# Patient Record
Sex: Female | Born: 2000 | Hispanic: Yes | Marital: Married | Smoking: Never smoker
Health system: Southern US, Community
[De-identification: ages and names within clinical notes are randomized; demographics above are authoritative.]

## PROBLEM LIST (undated history)

## (undated) DIAGNOSIS — J45909 Unspecified asthma, uncomplicated: Secondary | ICD-10-CM

## (undated) DIAGNOSIS — N390 Urinary tract infection, site not specified: Secondary | ICD-10-CM

## (undated) DIAGNOSIS — K5900 Constipation, unspecified: Secondary | ICD-10-CM

---
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
## 2019-08-06 IMAGING — CT CT ABD-PELV W/ CM
2 of 4 series · 16 of 46 positions shown, 18 images · IV contrast (omnipaque)
Comparison: 01/22/2018

CLINICAL DATA: Right lower quadrant pain with nausea for several
days

EXAM:
CT ABDOMEN AND PELVIS WITH CONTRAST
TECHNIQUE: Multidetector CT imaging of the abdomen and pelvis was performed
using the standard protocol following bolus administration of
intravenous contrast.
CONTRAST:  100mL OMNIPAQUE IOHEXOL 300 MG/ML  SOLN

[Series 3: abdomen 5.0 · axial · 0.76mm/px · z∈[-416,+9]mm · 13 of 95 slices shown, 15 images]
[im 5/95  soft-tissue]
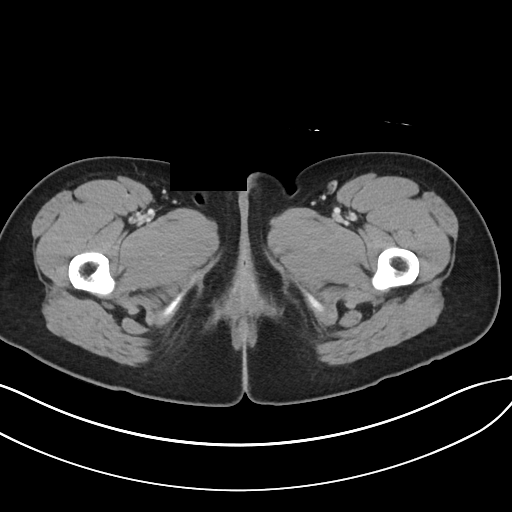
[im 5/95  bone]
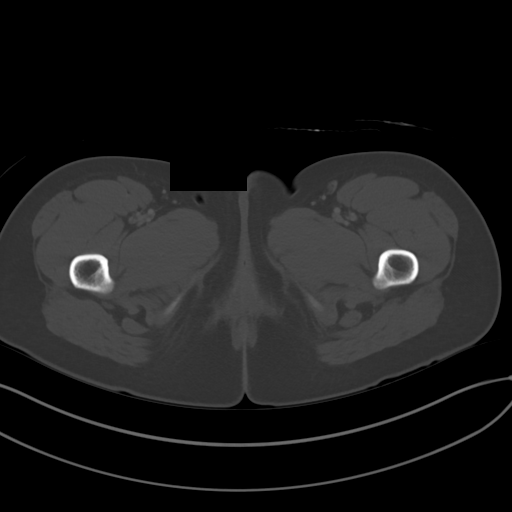
[im 13/95  soft-tissue]
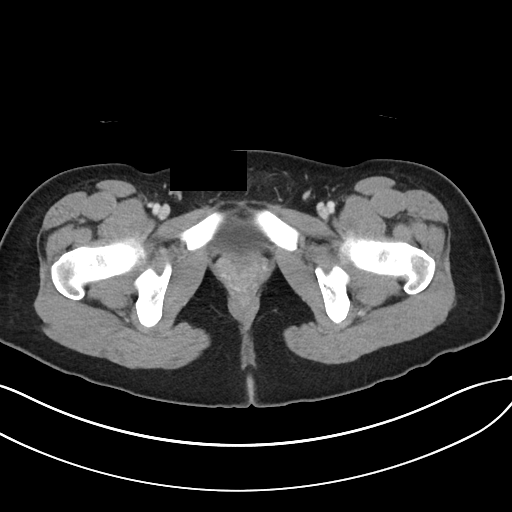
[im 22/95  soft-tissue]
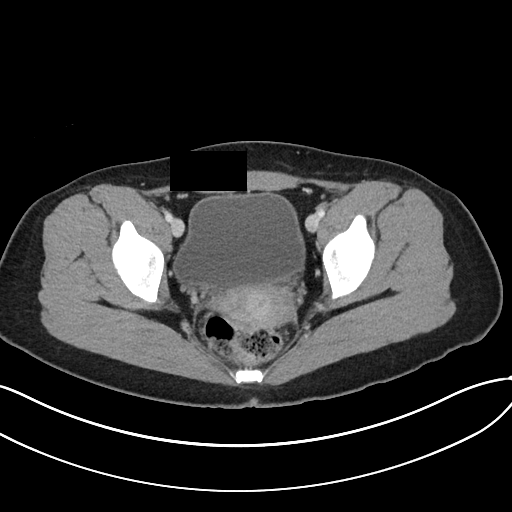
[im 26/95  soft-tissue]
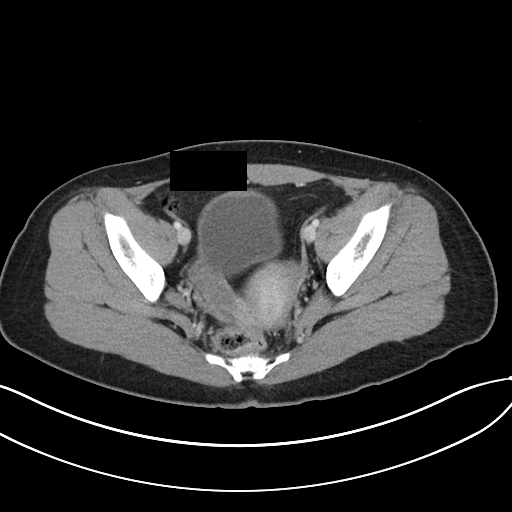
[im 35/95  soft-tissue]
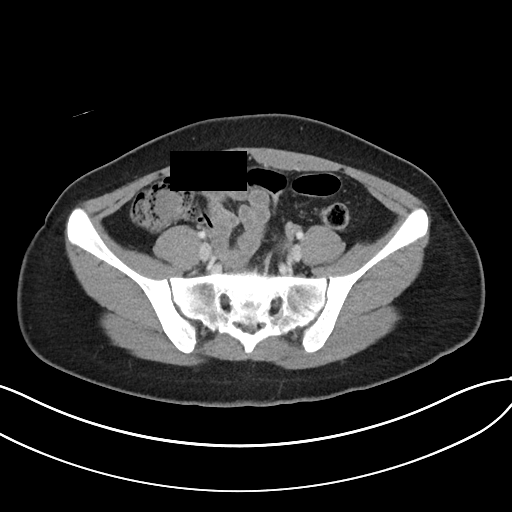
[im 39/95  soft-tissue]
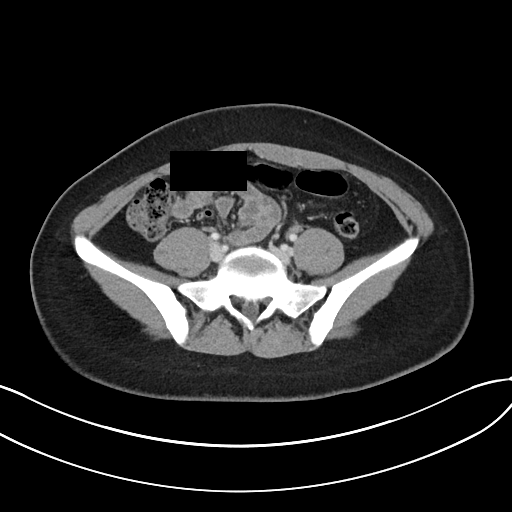
[im 48/95  soft-tissue]
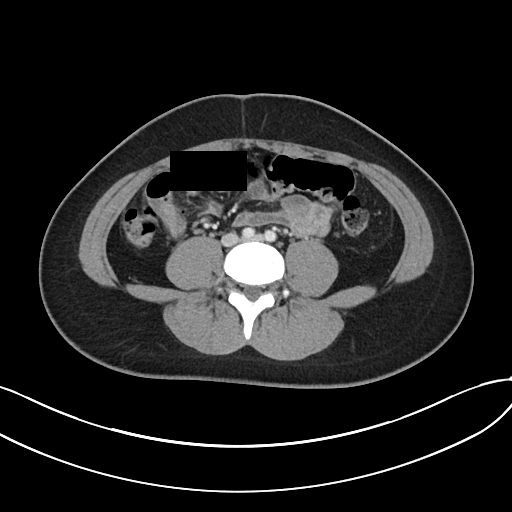
[im 56/95  soft-tissue]
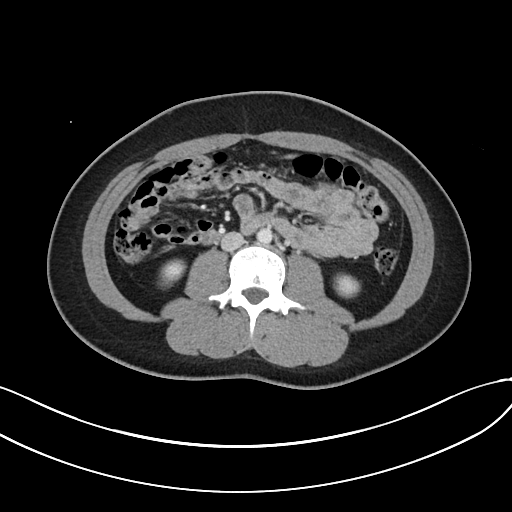
[im 60/95  soft-tissue]
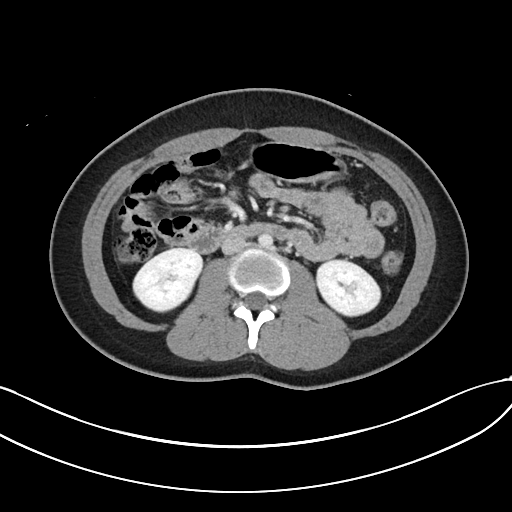
[im 60/95  bone]
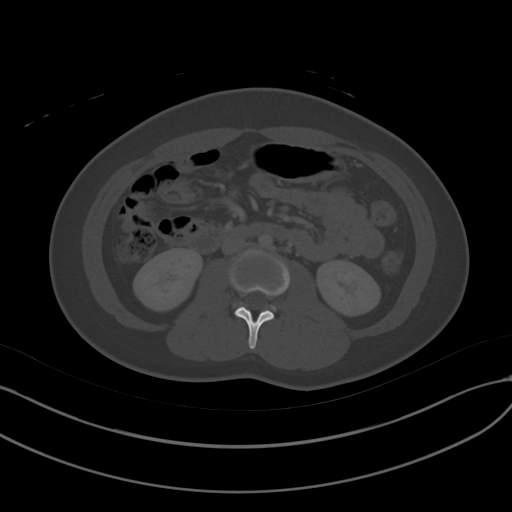
[im 69/95  soft-tissue]
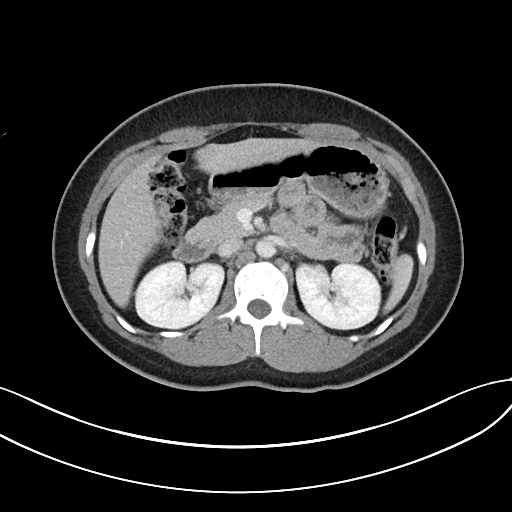
[im 73/95  soft-tissue]
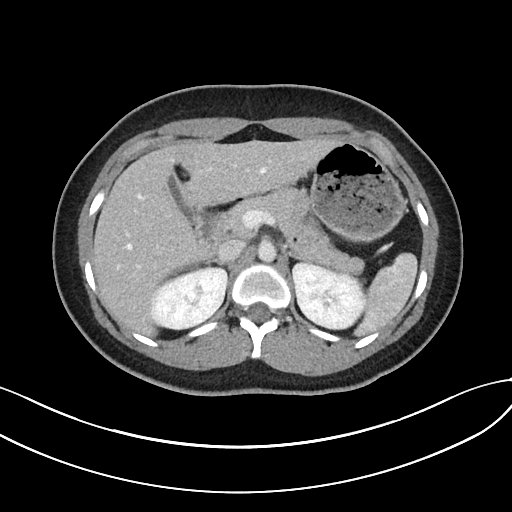
[im 82/95  soft-tissue]
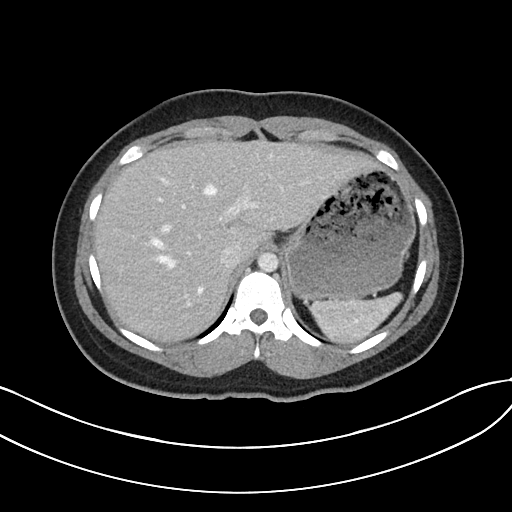
[im 90/95  soft-tissue]
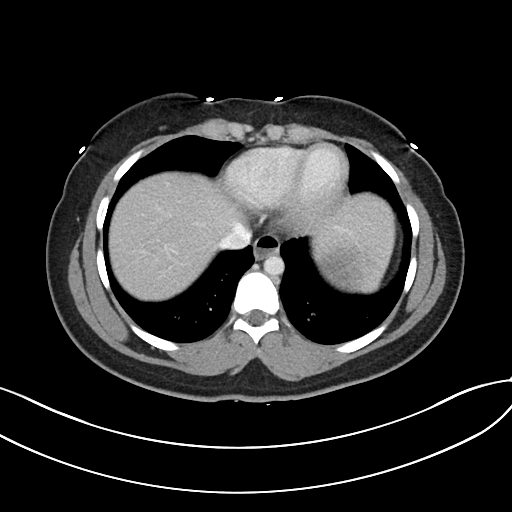

[Series 6: abdomen 3.0 mpr cor · coronal · 0.84mm/px · 3 of 99 slices shown]
[im 33/99  soft-tissue]
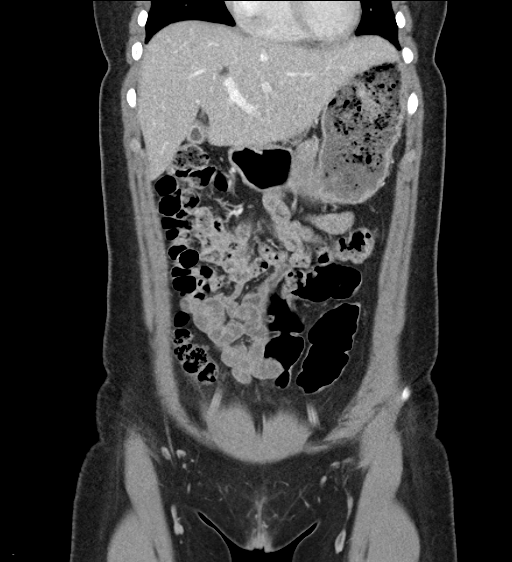
[im 44/99  soft-tissue]
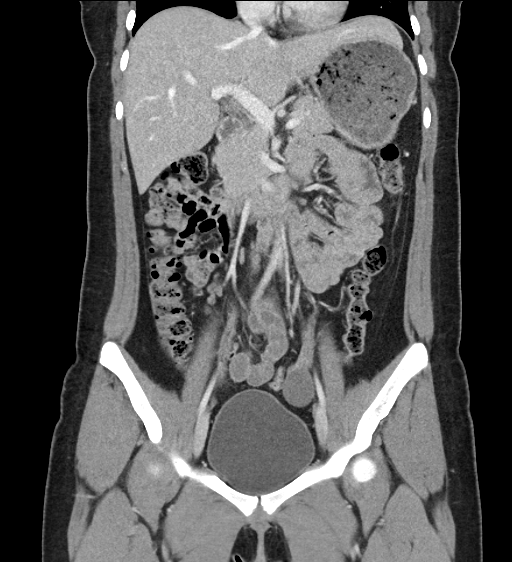
[im 55/99  soft-tissue]
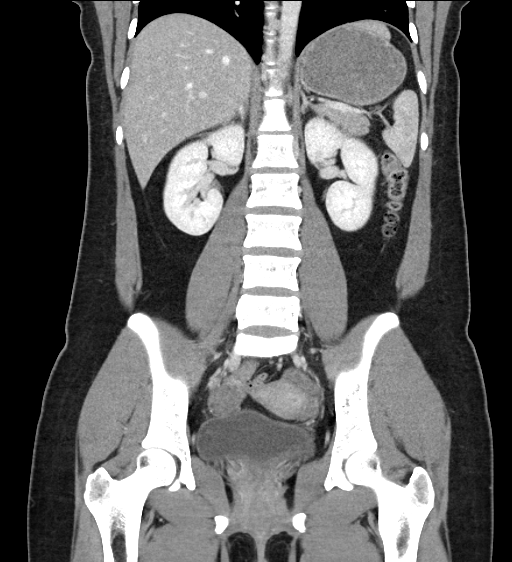

[16 of 46 positions shown; findings below may reference images not displayed]

FINDINGS: Lower chest: No acute abnormality.

Hepatobiliary: Small 1 cm hypodensity is noted within the left lobe
of the liver consistent with small cyst. The gallbladder is within
normal limits.

Pancreas: Unremarkable. No pancreatic ductal dilatation or
surrounding inflammatory changes.

Spleen: Normal in size without focal abnormality.

Adrenals/Urinary Tract: Adrenal glands are within normal limits.
Kidneys are well visualized bilaterally. No renal calculi or
obstructive changes are seen. The bladder is well distended.

Stomach/Bowel: Scattered diverticular changes noted without evidence
of diverticulitis. The appendix is within normal limits. No small
bowel abnormality is seen. The stomach is within normal limits. Mild
fluid is noted within the distal esophagus likely related to reflux.

Vascular/Lymphatic: No significant vascular findings are present. No
enlarged abdominal or pelvic lymph nodes.

Reproductive: Uterus is within normal limits. Follicular changes are
noted in the ovaries bilaterally. A dominant 3 cm cyst is noted
within the left ovary.

Other: No abdominal wall hernia or abnormality. No abdominopelvic
ascites.

Musculoskeletal: No acute or significant osseous findings.
IMPRESSION: No acute abnormality noted.

## 2019-08-30 IMAGING — US US ABDOMEN LIMITED
1 series · 14 of 25 positions shown · non-contrast
Comparison: CT scan of the abdomen dated 10/27/2018

CLINICAL DATA: Abdominal pain.  Elevated liver function tests.

EXAM:
ULTRASOUND ABDOMEN LIMITED RIGHT UPPER QUADRANT

[Series 1: us abdomen limited · 14 of 40 slices shown]
[im 1/40]
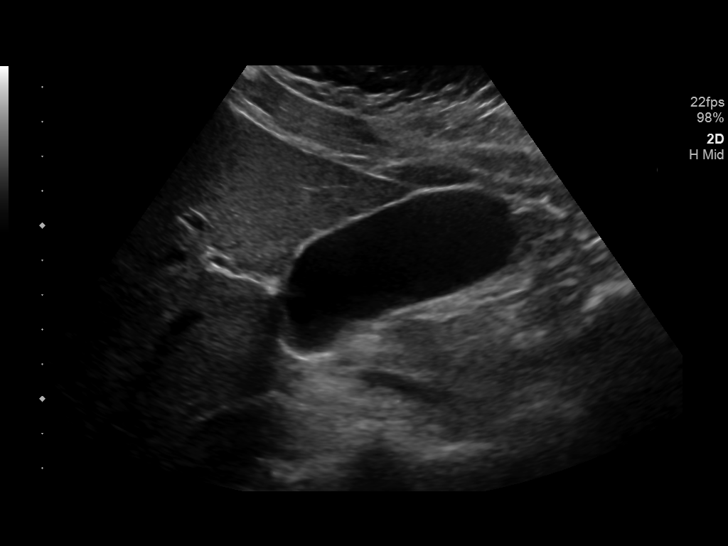
[im 4/40]
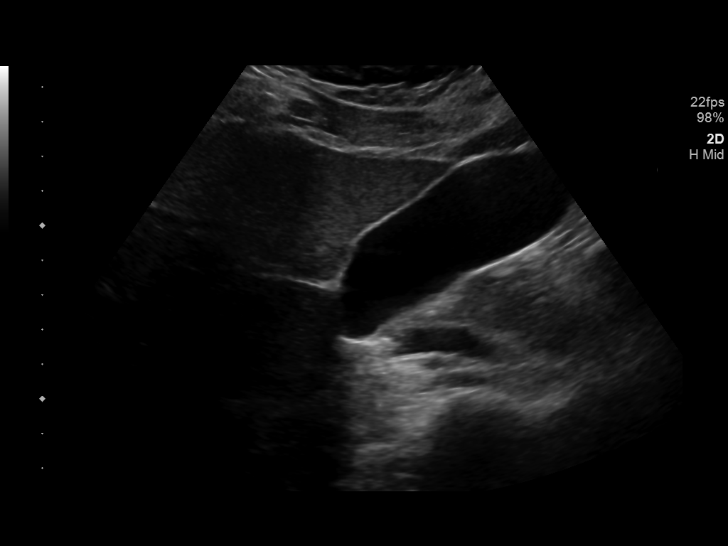
[im 7/40]
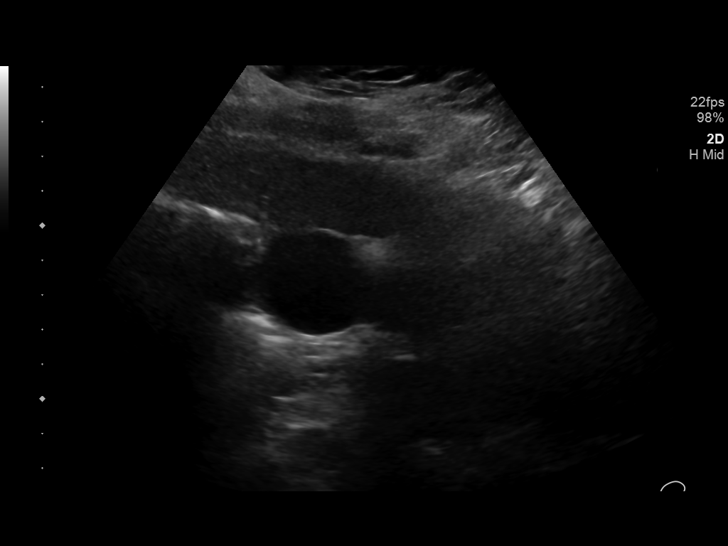
[im 10/40]
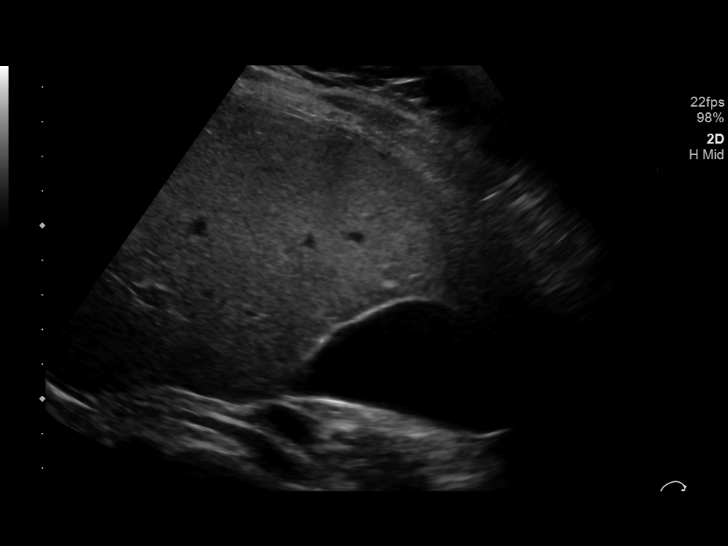
[im 14/40]
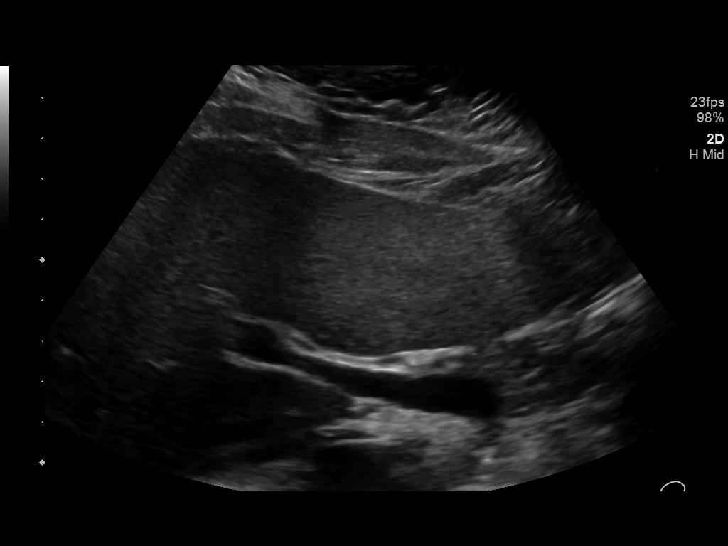
[im 15/40]
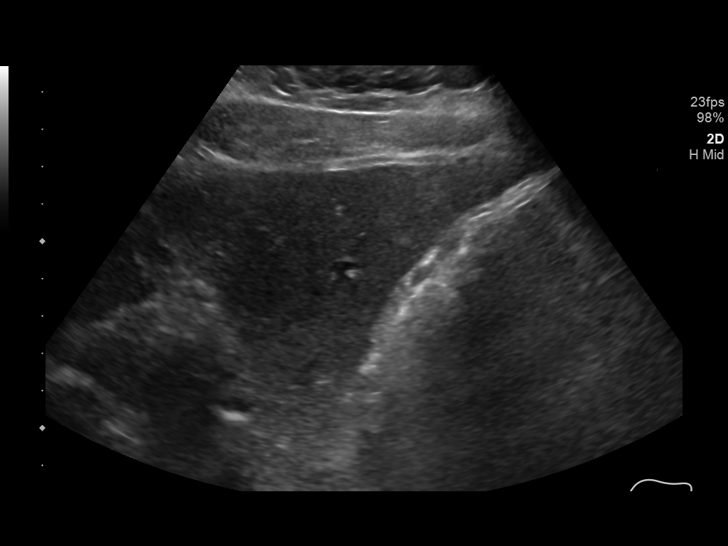
[im 18/40]
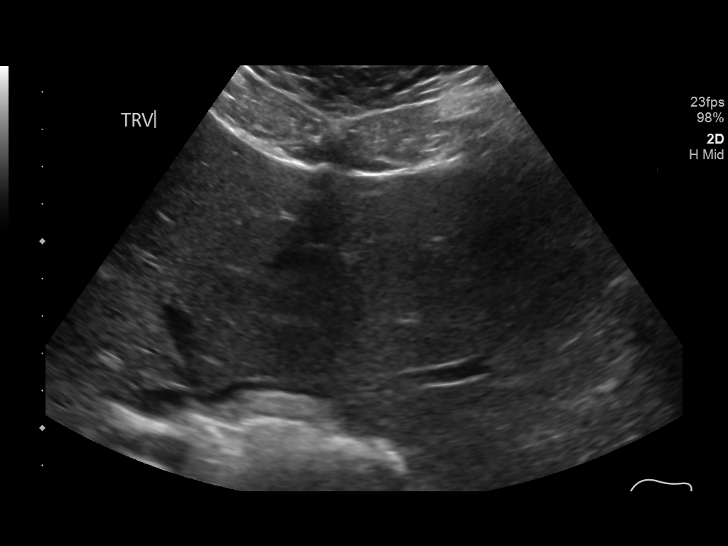
[im 22/40]
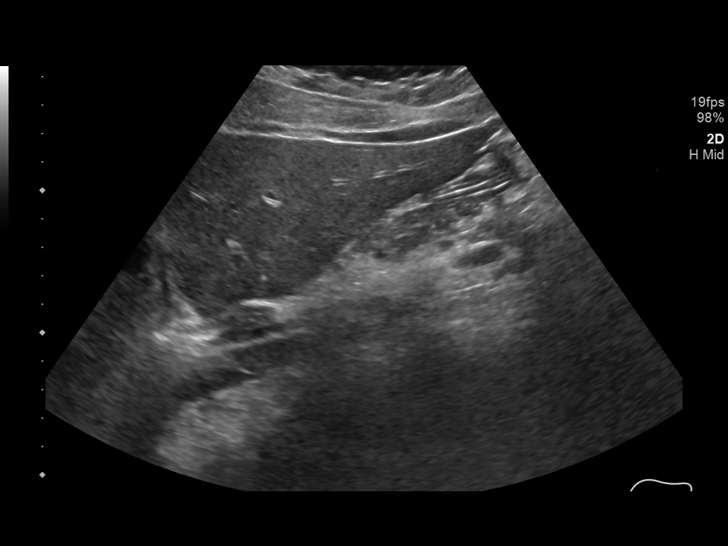
[im 25/40]
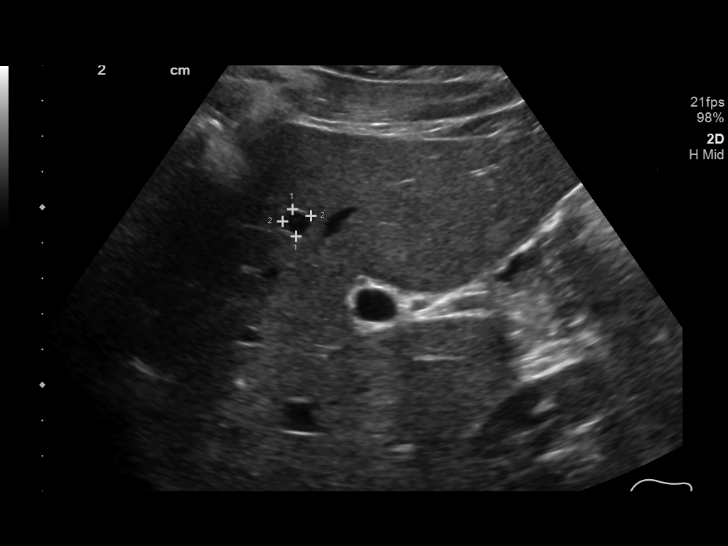
[im 27/40]
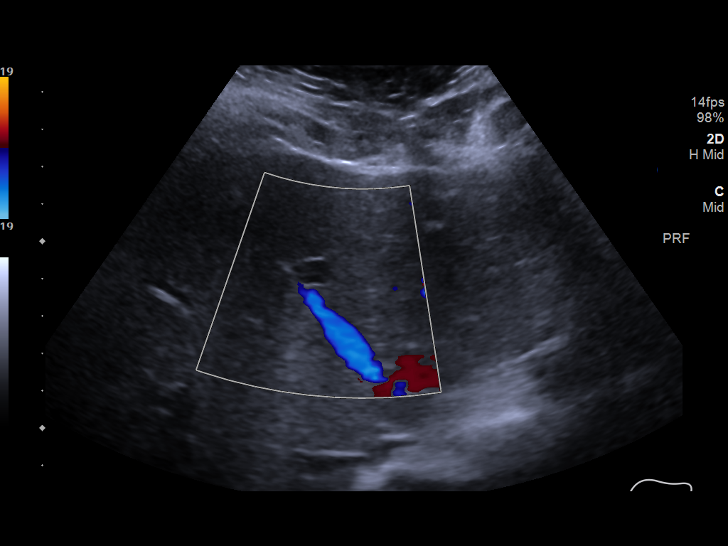
[im 30/40]
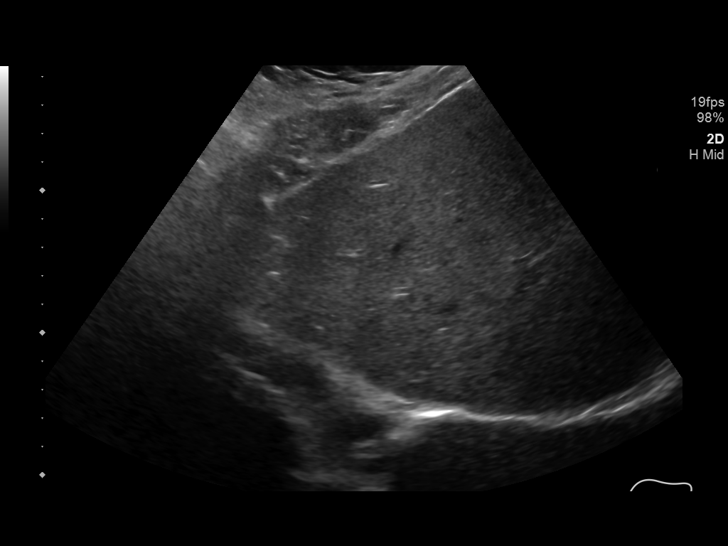
[im 33/40]
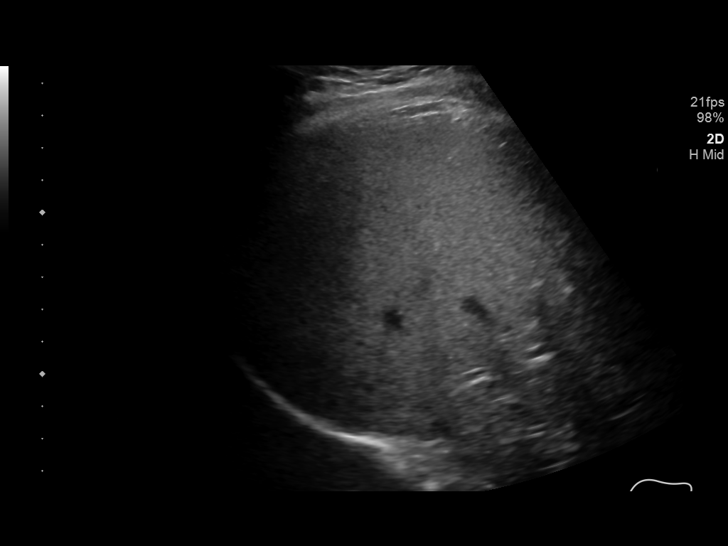
[im 36/40]
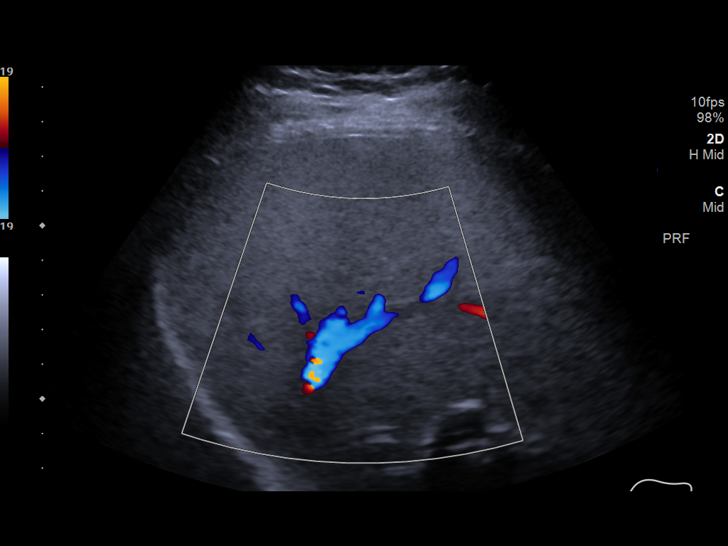
[im 40/40]
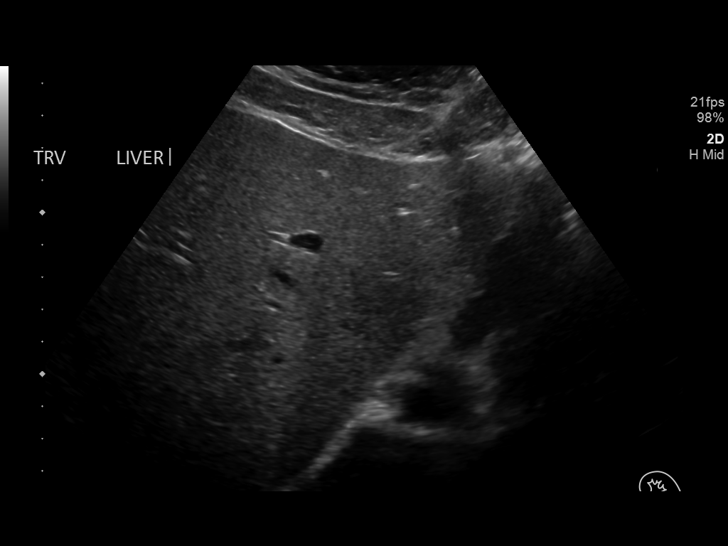

[14 of 25 positions shown; findings below may reference images not displayed]

FINDINGS: Gallbladder:

No gallstones or wall thickening visualized. No sonographic Murphy
sign noted by sonographer.

Common bile duct:

Diameter: 2.1 mm, normal.

Liver:

1.2 cm cyst in the left lobe of the liver as demonstrated on the
prior CT scan. This is not significant. Within normal limits in
parenchymal echogenicity. Portal vein is patent on color Doppler
imaging with normal direction of blood flow towards the liver.
IMPRESSION: No significant abnormalities.

## 2020-02-01 ENCOUNTER — Emergency Department (HOSPITAL_COMMUNITY)
Admission: EM | Admit: 2020-02-01 | Discharge: 2020-02-01 | Disposition: A | Discharge status: Home / Self Care | Payer: Medicaid Other | Attending: Emergency Medicine | Admitting: Emergency Medicine

## 2020-02-01 ENCOUNTER — Encounter (HOSPITAL_COMMUNITY): Payer: Self-pay | Admitting: Emergency Medicine

## 2020-02-01 ENCOUNTER — Other Ambulatory Visit: Payer: Self-pay

## 2020-02-01 DIAGNOSIS — Z5321 Procedure and treatment not carried out due to patient leaving prior to being seen by health care provider: Secondary | ICD-10-CM | POA: Insufficient documentation

## 2020-02-01 DIAGNOSIS — R1030 Lower abdominal pain, unspecified: Secondary | ICD-10-CM | POA: Diagnosis present

## 2020-02-01 LAB — CBC
HCT: 42.2 % (ref 36.0–46.0)
Hemoglobin: 13.6 g/dL (ref 12.0–15.0)
MCH: 30.1 pg (ref 26.0–34.0)
MCHC: 32.2 g/dL (ref 30.0–36.0)
MCV: 93.4 fL (ref 80.0–100.0)
Platelets: 382 10*3/uL (ref 150–400)
RBC: 4.52 MIL/uL (ref 3.87–5.11)
RDW: 13.4 % (ref 11.5–15.5)
WBC: 8.5 10*3/uL (ref 4.0–10.5)
nRBC: 0 % (ref 0.0–0.2)

## 2020-02-01 LAB — COMPREHENSIVE METABOLIC PANEL
ALT: 480 U/L — ABNORMAL HIGH (ref 0–44)
AST: 218 U/L — ABNORMAL HIGH (ref 15–41)
Albumin: 4.3 g/dL (ref 3.5–5.0)
Alkaline Phosphatase: 76 U/L (ref 38–126)
Anion gap: 9 (ref 5–15)
BUN: 5 mg/dL — ABNORMAL LOW (ref 6–20)
CO2: 23 mmol/L (ref 22–32)
Calcium: 9.3 mg/dL (ref 8.9–10.3)
Chloride: 107 mmol/L (ref 98–111)
Creatinine, Ser: 0.56 mg/dL (ref 0.44–1.00)
GFR calc Af Amer: >60 mL/min (ref >60)
GFR calc non Af Amer: >60 mL/min (ref >60)
Glucose, Bld: 98 mg/dL (ref 70–99)
Potassium: 4.8 mmol/L (ref 3.5–5.1)
Sodium: 139 mmol/L (ref 135–145)
Total Bilirubin: 0.4 mg/dL (ref 0.3–1.2)
Total Protein: 7.9 g/dL (ref 6.5–8.1)

## 2020-02-01 LAB — I-STAT BETA HCG BLOOD, ED (MC, WL, AP ONLY): I-stat hCG, quantitative: <5 m[IU]/mL (ref <5)

## 2020-02-01 LAB — LIPASE, BLOOD: Lipase: 32 U/L (ref 11–51)

## 2020-02-01 NOTE — ED Triage Notes (Signed)
Pt endorses intermittent lower abd pain and nausea for the month.

## 2020-03-10 ENCOUNTER — Ambulatory Visit (INDEPENDENT_AMBULATORY_CARE_PROVIDER_SITE_OTHER): Payer: Medicaid Other | Admitting: Obstetrics & Gynecology

## 2020-03-10 ENCOUNTER — Encounter: Payer: Self-pay | Admitting: Obstetrics & Gynecology

## 2020-03-10 ENCOUNTER — Other Ambulatory Visit: Payer: Self-pay

## 2020-03-10 VITALS — BP 112/65 | HR 59 | Wt 159.2 lb

## 2020-03-10 DIAGNOSIS — R1031 Right lower quadrant pain: Secondary | ICD-10-CM

## 2020-03-10 DIAGNOSIS — Z23 Encounter for immunization: Secondary | ICD-10-CM

## 2020-03-10 DIAGNOSIS — Z8742 Personal history of other diseases of the female genital tract: Secondary | ICD-10-CM

## 2020-03-10 DIAGNOSIS — Z3202 Encounter for pregnancy test, result negative: Secondary | ICD-10-CM | POA: Diagnosis not present

## 2020-03-10 DIAGNOSIS — Z7189 Other specified counseling: Secondary | ICD-10-CM | POA: Diagnosis not present

## 2020-03-10 DIAGNOSIS — N83201 Unspecified ovarian cyst, right side: Secondary | ICD-10-CM | POA: Diagnosis not present

## 2020-03-10 DIAGNOSIS — Z7185 Encounter for immunization safety counseling: Secondary | ICD-10-CM

## 2020-03-10 DIAGNOSIS — Z3045 Encounter for surveillance of transdermal patch hormonal contraceptive device: Secondary | ICD-10-CM

## 2020-03-10 LAB — POCT PREGNANCY, URINE: Preg Test, Ur: NEGATIVE

## 2020-03-10 MED ORDER — IBUPROFEN 800 MG PO TABS: 800.0000 mg | ORAL_TABLET | Freq: Three times a day (TID) | ORAL | 3 refills | Status: DC | PRN

## 2020-03-10 MED ORDER — XULANE 150-35 MCG/24HR TD PTWK: 1.0000 | MEDICATED_PATCH | TRANSDERMAL | 5 refills | Status: DC

## 2020-03-10 NOTE — Patient Instructions (Addendum)
Return to clinic for any scheduled appointments or for any gynecologic concerns as needed.   HPV (Human Papillomavirus) Vaccine: What You Need to Know 1. Why get vaccinated? HPV (Human papillomavirus) vaccine can prevent infection with some types of human papillomavirus. HPV infections can cause certain types of cancers including:  cervical, vaginal and vulvar cancers in women,  penile cancer in men, and  anal cancers in both men and women. HPV vaccine prevents infection from the HPV types that cause over 90% of these cancers. HPV is spread through intimate skin-to-skin or sexual contact. HPV infections are so common that nearly all men and women will get at least one type of HPV at some time in their lives. Most HPV infections go away by themselves within 2 years. But sometimes HPV infections will last longer and can cause cancers later in life. 2. HPV vaccine HPV vaccine is routinely recommended for adolescents at 50 or 20 years of age to ensure they are protected before they are exposed to the virus. HPV vaccine may be given beginning at age 70 years, and as late as age 31 years. Most people older than 26 years will not benefit from HPV vaccination. Talk with your health care provider if you want more information. Most children who get the first dose before 21 years of age need 2 doses of HPV vaccine. Anyone who gets the first dose on or after 20 years of age, and younger people with certain immunocompromising conditions, need 3 doses. Your health care provider can give you more information. HPV vaccine may be given at the same time as other vaccines. 3. Talk with your health care provider Tell your vaccine provider if the person getting the vaccine:  Has had an allergic reaction after a previous dose of HPV vaccine, or has any severe, life-threatening allergies.  Is pregnant. In some cases, your health care provider may decide to postpone HPV vaccination to a future visit. People with  minor illnesses, such as a cold, may be vaccinated. People who are moderately or severely ill should usually wait until they recover before getting HPV vaccine. Your health care provider can give you more information. 4. Risks of a vaccine reaction  Soreness, redness, or swelling where the shot is given can happen after HPV vaccine.  Fever or headache can happen after HPV vaccine. People sometimes faint after medical procedures, including vaccination. Tell your provider if you feel dizzy or have vision changes or ringing in the ears. As with any medicine, there is a very remote chance of a vaccine causing a severe allergic reaction, other serious injury, or death. 5. What if there is a serious problem? An allergic reaction could occur after the vaccinated person leaves the clinic. If you see signs of a severe allergic reaction (hives, swelling of the face and throat, difficulty breathing, a fast heartbeat, dizziness, or weakness), call 9-1-1 and get the person to the nearest hospital. For other signs that concern you, call your health care provider. Adverse reactions should be reported to the Vaccine Adverse Event Reporting System (VAERS). Your health care provider will usually file this report, or you can do it yourself. Visit the VAERS website at www.vaers.LAgents.no or call 435-706-6593. VAERS is only for reporting reactions, and VAERS staff do not give medical advice. 6. The National Vaccine Injury Compensation Program The [employer] Vaccine Injury Compensation Program (VICP) is a federal program that was created to compensate people who may have been injured by certain vaccines. Visit the [facility] website  at SpiritualWord.at or call 629 385 2984 to learn about the program and about filing a claim. There is a time limit to file a claim for compensation. 7. How can I learn more?  Ask your health care provider.  Call your local or state health department.  Contact the Centers for  Disease Control and Prevention (CDC): ? Call 3023386000 (1-800-CDC-INFO) or ? Visit CDC's website at PicCapture.uy Vaccine Information Statement HPV Vaccine (07/15/2018) This information is not intended to replace advice given to you by your health care provider. Make sure you discuss any questions you have with your health care provider. Document Revised: 12/22/2018 Document Reviewed: 04/14/2018 Elsevier Patient Education  2020 Elsevier Inc.   Preventing Cervical Cancer Cervical cancer is cancer that grows on the cervix. The cervix is at the bottom of the uterus. It connects the uterus to the vagina. The uterus is where a baby develops during pregnancy. Cancer occurs when cells become abnormal and start to grow out of control. If cervical cancer is not found early, it can spread and become dangerous. Cervical cancer cannot always be prevented, but you can take steps to lower your risk of developing this condition. How can this condition affect me? Cervical cancer grows slowly and may not cause any symptoms at first. Over time, the cancer can grow deep into the cervix tissue and spread to other areas. This may take years, and it may happen without you knowing about it. If it is found early, cervical cancer can be treated effectively. If the cancer has grown deep into your cervix or has spread, it will be more difficult to treat. Most cases of cervical cancer are caused by an STI (sexually transmitted infection) called human papillomavirus (HPV). One way to reduce your risk of cervical cancer is to take steps to avoid infection with the HPV virus. Getting regular Pap tests is also important because this can help identify changes in cells that could lead to cancer. Your chances of getting this disease can also be reduced by making certain lifestyle changes. What can increase my risk? You are more likely to develop this condition if:  You have certain things in your sexual history, such  as: ? Having a sexually transmitted viral infection. These include chlamydia and herpes. ? Having more than one sexual partner, or having sex with someone who has more than one sexual partner. ? Not using condoms during sex. ? Having been sexually active before the age of 9.  Your mother took a medicine called diethylstilbestrol (DES) while pregnant with you, causing you to be exposed to this medicine before birth.  Your mother or sister has had cervical cancer.  You are between the ages of 47-50.  You have or have had certain other medical conditions, such as: ? Previous cancer of the vagina or vulva. ? A weakened body defense system (immune system). ? A history of dysplasia of the cervix.  You use oral contraceptives, also called birth control pills.  You smoke or breathe in secondhand smoke. What actions can I take to prevent cervical cancer? Preventing HPV infection   Ask your health care provider about getting the HPV vaccine. If you are 50 years old or younger, you may need to get this vaccine, which is given in three doses over 6 months. This vaccine protects against the types of HPV that could cause cancer.  Limit the number of people you have sex with. Also avoid having sex with people who have had many sex partners.  Use  a latex condom every time you have sex. Getting Pap tests Get Pap tests regularly, starting at age 5. Talk with your health care provider about how often you need these tests. Having regular Pap tests will help identify changes in cells that could lead to cancer. Steps can then be taken to prevent cancer from developing.  Most women who are 2?20 years of age should have a Pap test every 3 years.  Most women who are 61?20 years of age should have a Pap test in combination with an HPV test every 5 years.  Women with a higher risk of cervical cancer, such as those with a weakened immune system or those who were exposed to DES medicine before birth, may  need more frequent testing. Making other lifestyle changes   Do not use any products that contain nicotine or tobacco, such as cigarettes, e-cigarettes, and chewing tobacco. If you need help quitting, ask your health care provider.  Eat a healthy diet that includes at least 5 servings of fruits and vegetables every day.  Lose weight if you are overweight. Where to find support Talk with your health care provider, school nurse, or local health department for guidance about screening and vaccination. Some children and teens may be able to get the HPV vaccine free of charge through the U.S. government's Vaccines for Children [facility]) program. Other places that provide vaccinations include:  Public health clinics. Check with your local health department.  [place], where you would pay only what you can afford. To find one near you, check this website: http://lyons.com/  [place]. These are part of a program for Medicare and Medicaid patients who live in rural areas. The National Breast and Cervical Cancer Early Detection Program also provides breast and cervical cancer screenings and diagnostic services to low-income, uninsured, and underinsured women. Cervical cancer can be passed down through families. Talk with your health care provider or a genetic counselor to learn more about genetic testing for cancer. Where to find more information Learn more about cervical cancer from:  [employer] of Gynecology: www.acog.org  American Cancer Society: www.cancer.org  Centers for Disease Control and Prevention: http://www.wolf.info/ Contact a health care provider if you have:  Pelvic pain.  Unusual discharge or bleeding from your vagina. Summary  Cervical cancer is cancer that grows on the cervix. The cervix is at the bottom of the uterus.  Ask your health care provider about getting the HPV vaccine.  Be sure to get regular Pap tests as recommended by  your health care provider.  See your health care provider right away if you have any pelvic pain or unusual discharge or bleeding from your vagina. This information is not intended to replace advice given to you by your health care provider. Make sure you discuss any questions you have with your health care provider. Document Revised: 04/05/2019 Document Reviewed: 04/05/2019 Elsevier Patient Education  [place].

## 2020-03-10 NOTE — Progress Notes (Signed)
GYNECOLOGY OFFICE VISIT NOTE  History:   Brooke Baxter is a 19 y.o. G0 here today for follow up after being prescribed patch for contraception and ovarian cyst suppression. She was supposed to follow up since 01/2019 (two months after patch was prescribed). She reports she stopped the patch a few months ago, no given reason, and has been having unprotected intercourse (last time was 2 days ago).  Wants to start patch again.  LMP 4 months ago, she does not think she is pregnant. She denies any abnormal vaginal discharge, bleeding, pelvic pain or other concerns.    Past Medical History:  Diagnosis Date   Asthma    Constipation    UTI (urinary tract infection)     History reviewed. No pertinent surgical history.  The following portions of the patient's history were reviewed and updated as appropriate: allergies, current medications, past family history, past medical history, past social history, past surgical history and problem list.   Health Maintenance:  Has not received her HPV vaccine series.  Review of Systems:  Pertinent items noted in HPI and remainder of comprehensive ROS otherwise negative.  Physical Exam:  BP 112/65    Pulse (!) 59    Wt 159 lb 3.2 oz (72.2 kg)    BMI 29.12 kg/m  CONSTITUTIONAL: Well-developed, well-nourished female in no acute distress.  HEENT:  Normocephalic, atraumatic. External right and left ear normal. No scleral icterus.  NECK: Normal range of motion, supple, no masses noted on observation SKIN: No rash noted. Not diaphoretic. No erythema. No pallor. MUSCULOSKELETAL: Normal range of motion. No edema noted. NEUROLOGIC: Alert and oriented to person, place, and time. Normal muscle tone coordination. No cranial nerve deficit noted. PSYCHIATRIC: Normal mood and affect. Normal behavior. Normal judgment and thought content. CARDIOVASCULAR: Normal heart rate noted RESPIRATORY: Effort and breath sounds normal, no problems with respiration  noted ABDOMEN: Mild RLQ pain on palpation. No masses noted. No other overt distention noted.   PELVIC: Deferred   Labs: Results for orders placed or performed in visit on 03/10/20 (from the past 24 hour(s))  Pregnancy, urine POC     Status: None   Collection Time: 03/10/20  9:46 AM  Result Value Ref Range   Preg Test, Ur NEGATIVE NEGATIVE      Assessment and Plan:       1. Encounter for surveillance of transdermal patch hormonal contraceptive device Pregnancy test today was negative.  Re-prescribed the patch, encouraged to start on Sunday after next menses to ensure no pregnancy this cycle. Strongly emphasized use of condoms 100% for contraception and STI prophylaxis. Reevaluate in 2 months.  - norelgestromin-ethinyl estradiol [employer]) 150-35 MCG/24HR transdermal patch; Place 1 patch onto the skin See admin instructions. Apply one patch topically every Sunday for 3 weeks, hold while on period for one week  then resume.  Dispense: 3 patch; Refill: 5  2. Hx of right ovarian cyst 3. Right lower quadrant abdominal pain Will obtain pelvic ultrasound. Told to take Ibuprofen as needed.  Patch will also help in ovarian cyst suppression. - US PELVIC COMPLETE WITH TRANSVAGINAL; Future - ibuprofen (ADVIL) 800 MG tablet; Take 1 tablet (800 mg total) by mouth 3 (three) times daily with meals as needed for headache, moderate pain or cramping.  Dispense: 30 tablet; Refill: 3  4. HPV vaccine counseling Counseled about HPV vaccine, she agrees to start this today - HPV 9-valent vaccine,Recombinat Other routine preventative health maintenance measures emphasized; will start pap smears next year.  Please  refer to After Visit Summary for other counseling recommendations.   Return in about 2 months (around 05/10/2020) for Patch follow up, BP check, Gardasil #2//6 months for now: RN visit for Gardasil #3.    Total face-to-face time with patient: 20 minutes.  Over 50% of encounter was spent on counseling and  coordination of care.   Verita Schneiders, MD, [place] [facility] [employer], [place]

## 2020-03-31 ENCOUNTER — Ambulatory Visit: Payer: Medicaid Other | Attending: Obstetrics & Gynecology

## 2020-05-08 ENCOUNTER — Ambulatory Visit: Payer: Medicaid Other | Admitting: Certified Nurse Midwife

## 2020-05-08 ENCOUNTER — Telehealth: Payer: Self-pay

## 2020-05-08 NOTE — Telephone Encounter (Signed)
[facility] follow up appointment for patch and BP check. Discussed with provider and called patient as requested. Patient states no concerns or abnormalities regarding patch. Explained to patient will have registrar call her for nurse visit for BP check only.   Bryson Dames., RN

## 2020-05-12 ENCOUNTER — Other Ambulatory Visit: Payer: Self-pay

## 2020-05-12 ENCOUNTER — Encounter: Payer: Self-pay | Admitting: *Deleted

## 2020-05-12 ENCOUNTER — Ambulatory Visit (INDEPENDENT_AMBULATORY_CARE_PROVIDER_SITE_OTHER): Payer: Medicaid Other | Admitting: *Deleted

## 2020-05-12 VITALS — BP 114/69 | HR 59 | Ht 62.0 in | Wt 157.6 lb

## 2020-05-12 DIAGNOSIS — Z0130 Encounter for examination of blood pressure without abnormal findings: Secondary | ICD-10-CM

## 2020-05-12 DIAGNOSIS — Z789 Other specified health status: Secondary | ICD-10-CM

## 2020-05-12 NOTE — Progress Notes (Signed)
Pt is here for BP check following the start of birth control patch in June.  BP - 114/69  P -59.  Procedure for patch use was reviewed as pt had some questions. She states she has been feeling very tired x2 weeks. I advised pt to begin taking a multivitamin daily and try to incorporate some light exercise several times per week. She should follow up with her PCP if this continues. Pt voiced understanding.

## 2020-05-15 NOTE — Progress Notes (Signed)
Patient was assessed and managed by nursing staff during this encounter. I have reviewed the chart and agree with the documentation and plan. I have also made any necessary editorial changes.  Catalina Antigua, MD 05/15/2020 8:14 AM

## 2020-06-09 ENCOUNTER — Telehealth: Payer: Self-pay | Admitting: Family Medicine

## 2020-06-09 ENCOUNTER — Ambulatory Visit: Payer: Medicaid Other

## 2020-06-09 NOTE — Telephone Encounter (Signed)
Attempted to reach patient with both numbers in Epic. Was not able to leave a message about her missed appointment.

## 2020-09-07 ENCOUNTER — Ambulatory Visit: Payer: Medicaid Other

## 2023-03-19 ENCOUNTER — Encounter (HOSPITAL_BASED_OUTPATIENT_CLINIC_OR_DEPARTMENT_OTHER): Payer: Self-pay | Admitting: Emergency Medicine

## 2023-03-19 ENCOUNTER — Other Ambulatory Visit: Payer: Self-pay

## 2023-03-19 DIAGNOSIS — R7401 Elevation of levels of liver transaminase levels: Secondary | ICD-10-CM | POA: Insufficient documentation

## 2023-03-19 DIAGNOSIS — N83202 Unspecified ovarian cyst, left side: Secondary | ICD-10-CM | POA: Insufficient documentation

## 2023-03-19 DIAGNOSIS — J45909 Unspecified asthma, uncomplicated: Secondary | ICD-10-CM | POA: Insufficient documentation

## 2023-03-19 DIAGNOSIS — N83201 Unspecified ovarian cyst, right side: Secondary | ICD-10-CM | POA: Insufficient documentation

## 2023-03-19 LAB — CBC
HCT: 41.2 % (ref 36.0–46.0)
Hemoglobin: 14 g/dL (ref 12.0–15.0)
MCH: 30.7 pg (ref 26.0–34.0)
MCHC: 34 g/dL (ref 30.0–36.0)
MCV: 90.4 fL (ref 80.0–100.0)
Platelets: 381 10*3/uL (ref 150–400)
RBC: 4.56 MIL/uL (ref 3.87–5.11)
RDW: 12.6 % (ref 11.5–15.5)
WBC: 8.9 10*3/uL (ref 4.0–10.5)
nRBC: 0 % (ref 0.0–0.2)

## 2023-03-19 LAB — URINALYSIS, ROUTINE W REFLEX MICROSCOPIC
Bilirubin Urine: NEGATIVE
Glucose, UA: NEGATIVE mg/dL
Hgb urine dipstick: NEGATIVE
Ketones, ur: NEGATIVE mg/dL
Leukocytes,Ua: NEGATIVE
Nitrite: NEGATIVE
Protein, ur: 30 mg/dL — AB
Specific Gravity, Urine: 1.025 (ref 1.005–1.030)
pH: 8 (ref 5.0–8.0)

## 2023-03-19 LAB — COMPREHENSIVE METABOLIC PANEL
ALT: 208 U/L — ABNORMAL HIGH (ref 0–44)
AST: 83 U/L — ABNORMAL HIGH (ref 15–41)
Albumin: 4.4 g/dL (ref 3.5–5.0)
Alkaline Phosphatase: 87 U/L (ref 38–126)
Anion gap: 9 (ref 5–15)
BUN: 8 mg/dL (ref 6–20)
CO2: 25 mmol/L (ref 22–32)
Calcium: 9.4 mg/dL (ref 8.9–10.3)
Chloride: 105 mmol/L (ref 98–111)
Creatinine, Ser: 0.78 mg/dL (ref 0.44–1.00)
GFR, Estimated: >60 mL/min (ref >60)
Glucose, Bld: 93 mg/dL (ref 70–99)
Potassium: 3.5 mmol/L (ref 3.5–5.1)
Sodium: 139 mmol/L (ref 135–145)
Total Bilirubin: 0.3 mg/dL (ref 0.3–1.2)
Total Protein: 8.3 g/dL — ABNORMAL HIGH (ref 6.5–8.1)

## 2023-03-19 LAB — URINALYSIS, MICROSCOPIC (REFLEX): RBC / HPF: NONE SEEN RBC/hpf (ref 0–5)

## 2023-03-19 LAB — LIPASE, BLOOD: Lipase: 43 U/L (ref 11–51)

## 2023-03-19 LAB — PREGNANCY, URINE: Preg Test, Ur: NEGATIVE

## 2023-03-19 NOTE — ED Triage Notes (Signed)
Pt reports ABD pain when "carrying something heavy", reports some nausea, denies vomiting, states this started 10 days ago

## 2023-03-20 ENCOUNTER — Emergency Department (HOSPITAL_BASED_OUTPATIENT_CLINIC_OR_DEPARTMENT_OTHER): Payer: Self-pay

## 2023-03-20 ENCOUNTER — Emergency Department (HOSPITAL_BASED_OUTPATIENT_CLINIC_OR_DEPARTMENT_OTHER)
Admission: EM | Admit: 2023-03-20 | Discharge: 2023-03-20 | Disposition: A | Discharge status: Home / Self Care | Payer: Self-pay | Attending: Emergency Medicine | Admitting: Emergency Medicine

## 2023-03-20 DIAGNOSIS — N83201 Unspecified ovarian cyst, right side: Secondary | ICD-10-CM

## 2023-03-20 DIAGNOSIS — R7989 Other specified abnormal findings of blood chemistry: Secondary | ICD-10-CM

## 2023-03-20 DIAGNOSIS — R1030 Lower abdominal pain, unspecified: Secondary | ICD-10-CM

## 2023-03-20 MED ORDER — DICYCLOMINE HCL 20 MG PO TABS: 20.0000 mg | ORAL_TABLET | Freq: Three times a day (TID) | ORAL | 0 refills | Status: DC | PRN

## 2023-03-20 MED ORDER — ONDANSETRON HCL 4 MG/2ML IJ SOLN
4.0000 mg | Freq: Once | INTRAMUSCULAR | Status: AC
  Administered 2023-03-20: 4 mg via INTRAVENOUS
  Filled 2023-03-20: qty 2

## 2023-03-20 MED ORDER — ONDANSETRON 4 MG PO TBDP: 4.0000 mg | ORAL_TABLET | Freq: Three times a day (TID) | ORAL | 0 refills | Status: DC | PRN

## 2023-03-20 MED ORDER — IOHEXOL 300 MG/ML  SOLN
100.0000 mL | Freq: Once | INTRAMUSCULAR | Status: AC | PRN
  Administered 2023-03-20: 100 mL via INTRAVENOUS

## 2023-03-20 MED ORDER — IBUPROFEN 600 MG PO TABS: 600.0000 mg | ORAL_TABLET | Freq: Four times a day (QID) | ORAL | 0 refills | Status: DC | PRN

## 2023-03-20 MED ORDER — SODIUM CHLORIDE 0.9 % IV BOLUS
500.0000 mL | Freq: Once | INTRAVENOUS | Status: AC
  Administered 2023-03-20: 500 mL via INTRAVENOUS

## 2023-03-20 NOTE — ED Notes (Signed)
Pt back from CT and had increased nausea during scan. Dr long aware and order placed for Zofran

## 2023-03-20 NOTE — Discharge Instructions (Addendum)
You were seen in the emerged from today with abdominal pain.  Your CT scan shows ovarian cyst which may be causing some of your discomfort.  Your liver enzymes were slightly elevated.  I would like for you to call your primary care doctor for repeat liver labs in the next week.  If these remain elevated you may need further workup or possible referral to a gastroenterologist.  Return to the emergency department if you have sudden worsening pain in your abdomen not responding to home medications.

## 2023-03-20 NOTE — ED Provider Notes (Signed)
Emergency Department Provider Note   I have reviewed the triage vital signs and the nursing notes.   HISTORY  Chief Complaint Abdominal Pain   HPI Brooke Baxter is a 22 y.o. female presents emergency department valuation of lower abdominal pain.  She describes a bandlike pain across her lower abdomen.  Symptoms been present for the past 10 days.  She feels worse when she is carrying or lifting but has not noticed any masses or hernias.  She notes some occasional vaginal spotting but no discharge.  No UTI symptoms.  No diarrhea or vomiting.  No fevers or chills.  Symptoms improved with rest.    Past Medical History:  Diagnosis Date   Asthma    Constipation    UTI (urinary tract infection)     Review of Systems  Constitutional: No fever/chills Cardiovascular: Denies chest pain. Respiratory: Denies shortness of breath. Gastrointestinal: Positive lower abdominal pain.  No nausea, no vomiting.  No diarrhea.  No constipation. Genitourinary: Negative for dysuria. Musculoskeletal: Negative for back pain. Skin: Negative for rash. Neurological: Negative for headaches.   ____________________________________________   PHYSICAL EXAM:  VITAL SIGNS: ED Triage Vitals  Enc Vitals Group     BP 03/19/23 2229 127/72     Pulse Rate 03/19/23 2229 62     Resp 03/19/23 2229 18     Temp 03/19/23 2229 98.6 F (37 C)     Temp Source 03/20/23 0036 Oral     SpO2 03/19/23 2229 98 %     Weight 03/19/23 2242 181 lb (82.1 kg)     Height 03/19/23 2242 5\' 2"  (1.575 m)   Constitutional: Alert and oriented. Well appearing and in no acute distress. Eyes: Conjunctivae are normal.  Head: Atraumatic. Nose: No congestion/rhinnorhea. Mouth/Throat: Mucous membranes are moist.   Neck: No stridor.   Cardiovascular: Normal rate, regular rhythm. Good peripheral circulation. Grossly normal heart sounds.   Respiratory: Normal respiratory effort.  No retractions. Lungs  CTAB. Gastrointestinal: Soft with mild tenderness in the lower abdomen. No peritonitis. No distention.  Musculoskeletal: No lower extremity tenderness nor edema. No gross deformities of extremities. Neurologic:  Normal speech and language. No gross focal neurologic deficits are appreciated.  Skin:  Skin is warm, dry and intact. No rash noted.  ____________________________________________   LABS (all labs ordered are listed, but only abnormal results are displayed)  Labs Reviewed  COMPREHENSIVE METABOLIC PANEL - Abnormal; Notable for the following components:      Result Value   Total Protein 8.3 (*)    AST 83 (*)    ALT 208 (*)    All other components within normal limits  URINALYSIS, ROUTINE W REFLEX MICROSCOPIC - Abnormal; Notable for the following components:   Protein, ur 30 (*)    All other components within normal limits  URINALYSIS, MICROSCOPIC (REFLEX) - Abnormal; Notable for the following components:   Bacteria, UA FEW (*)    All other components within normal limits  LIPASE, BLOOD  CBC  PREGNANCY, URINE   ____________________________________________  RADIOLOGY  CT ABDOMEN PELVIS W CONTRAST  Result Date: 03/20/2023 CLINICAL DATA:  Abdominal pain, nausea, UTI, constipation EXAM: CT ABDOMEN AND PELVIS WITH CONTRAST TECHNIQUE: Multidetector CT imaging of the abdomen and pelvis was performed using the standard protocol following bolus administration of intravenous contrast. RADIATION DOSE REDUCTION: This exam was performed according to the departmental dose-optimization program which includes automated exposure control, adjustment of the mA and/or kV according to patient size and/or use of iterative reconstruction technique.  CONTRAST:  OMNIPAQUE IOHEXOL 300 MG/ML  SOLN COMPARISON:  10/27/2018 FINDINGS: Lower chest: Lung bases are clear. Hepatobiliary: 16 mm central left hepatic cyst (series 2/image 19), benign. Gallbladder is unremarkable. No intrahepatic or extrahepatic  duct dilatation. Pancreas: Within normal limits. Spleen: Within normal limits Adrenals/Urinary Tract: Adrenal glands are within normal limits. Kidneys are within normal limits.  No hydronephrosis. Bladder is within normal limits. Stomach/Bowel: Stomach is within normal limits. No evidence of bowel obstruction. Normal appendix (series 2/image 66). No colonic wall thickening or inflammatory changes. Vascular/Lymphatic: No evidence of abdominal aortic aneurysm. No suspicious abdominopelvic lymphadenopathy. Reproductive: Uterus is within normal limits. Right ovary is within normal limits, noting a 2.6 cm simple cyst/follicle (series 2/image 56), physiologic. No follow-up is recommended. Left ovary is within normal limits, noting a 3.2 cm hemorrhagic cyst/follicle (series 2/image 35), physiologic. No follow-up is recommended. Other: No abdominopelvic ascites. Musculoskeletal: Visualized osseous structures are within normal limits. IMPRESSION: Negative CT abdomen/pelvis. Physiologic ovarian cysts/follicles, as above. No follow-up is recommended. Electronically Signed   By: Charline Bills M.D.   On: 03/20/2023 01:37    ____________________________________________   PROCEDURES  Procedure(s) performed:   Procedures  None  ____________________________________________   INITIAL IMPRESSION / ASSESSMENT AND PLAN / ED COURSE  Pertinent labs & imaging results that were available during my care of the patient were reviewed by me and considered in my medical decision making (see chart for details).   This patient is Presenting for Evaluation of abdominal pain, which does require a range of treatment options, and is a complaint that involves a high risk of morbidity and mortality.  The Differential Diagnoses includes but is not exclusive to ectopic pregnancy, ovarian cyst, ovarian torsion, acute appendicitis, urinary tract infection, endometriosis, bowel obstruction, hernia, colitis, renal colic,  gastroenteritis, volvulus etc.   Critical Interventions-    Medications  sodium chloride 0.9 % bolus 500 mL (0 mLs Intravenous Stopped 03/20/23 0215)  iohexol (OMNIPAQUE) 300 MG/ML solution 100 mL (100 mLs Intravenous Contrast Given 03/20/23 0123)  ondansetron (ZOFRAN) injection 4 mg (4 mg Intravenous Given 03/20/23 0136)    Reassessment after intervention: symptoms improved.    I did obtain Additional Historical Information from SO at bedside.    Clinical Laboratory Tests Ordered, included mild LFT elevation with normal bilirubin.  Lipase normal.  UA without infection.  CBC without leukocytosis.  Negative pregnancy.  Radiologic Tests Ordered, included CT abdomen/pelvis. I independently interpreted the images and agree with radiology interpretation.   Cardiac Monitor Tracing which shows NSR.    Social Determinants of Health Risk patient is a non-smoker.   Medical Decision Making: Summary:  Patient presents emergency department valuation of lower abdominal pain.  No vaginal bleeding or discharge.  Mild tenderness on exam. Plan for labs and imaging.   Reevaluation with update and discussion with patient.  No acute findings on CT.  Possibly, patient experiencing mild symptoms from ovarian cyst.  Considered torsion is a possibility but exam and history not consistent.  Plan for symptom management and GYN follow-up.  Contact information provided at discharge.  Patient's presentation is most consistent with acute, uncomplicated illness.   Disposition: discharge  ____________________________________________  FINAL CLINICAL IMPRESSION(S) / ED DIAGNOSES  Final diagnoses:  Lower abdominal pain  Elevated LFTs  Bilateral ovarian cysts     NEW OUTPATIENT MEDICATIONS STARTED DURING THIS VISIT:  Discharge Medication List as of 03/20/2023  2:17 AM     START taking these medications   Details  dicyclomine (BENTYL) 20  MG tablet Take 1 tablet (20 mg total) by mouth 3 (three) times daily as  needed for spasms., Starting Thu 03/20/2023, Normal    ondansetron (ZOFRAN-ODT) 4 MG disintegrating tablet Take 1 tablet (4 mg total) by mouth every 8 (eight) hours as needed., Starting Thu 03/20/2023, Normal        Note:  This document was prepared using Dragon voice recognition software and may include unintentional dictation errors.  Alona Bene, MD, [facility]    Ruhee Enck, Arlyss Repress, MD 03/20/23 [phone]

## 2023-11-22 ENCOUNTER — Other Ambulatory Visit: Payer: Self-pay

## 2023-11-22 ENCOUNTER — Ambulatory Visit
Admission: EM | Admit: 2023-11-22 | Discharge: 2023-11-22 | Disposition: A | Discharge status: Home / Self Care | Payer: Self-pay | Attending: Family Medicine | Admitting: Family Medicine

## 2023-11-22 DIAGNOSIS — R1030 Lower abdominal pain, unspecified: Secondary | ICD-10-CM

## 2023-11-22 DIAGNOSIS — R112 Nausea with vomiting, unspecified: Secondary | ICD-10-CM

## 2023-11-22 DIAGNOSIS — R197 Diarrhea, unspecified: Secondary | ICD-10-CM

## 2023-11-22 LAB — POCT URINE PREGNANCY: Preg Test, Ur: NEGATIVE

## 2023-11-22 LAB — POCT URINALYSIS DIP (MANUAL ENTRY)
Bilirubin, UA: NEGATIVE
Blood, UA: NEGATIVE
Glucose, UA: NEGATIVE mg/dL
Ketones, POC UA: NEGATIVE mg/dL
Leukocytes, UA: NEGATIVE
Nitrite, UA: NEGATIVE
Spec Grav, UA: >=1.03 — AB (ref 1.010–1.025)
Urobilinogen, UA: 0.2 U/dL
pH, UA: 5.5 (ref 5.0–8.0)

## 2023-11-22 MED ORDER — ONDANSETRON 4 MG PO TBDP: 4.0000 mg | ORAL_TABLET | Freq: Three times a day (TID) | ORAL | 0 refills | Status: AC | PRN

## 2023-11-22 NOTE — Discharge Instructions (Signed)
 Increase fluid intake, recommend BRAT diet, and bland foods.  Go to the emergency department if your symptoms worsen or you develop fever, severe pain ,intolerable pain new symptoms or concerns. Follow-up with your primary care doctor on Monday

## 2023-11-22 NOTE — ED Triage Notes (Signed)
 Pt presents with complaints of nausea and generalized abdominal pain ("stomach turning") x 1 week. Pt states an increase in fatigue recently. Pt currently denies pain. Pt states she feels the need to vomit with just the thought of food. Pt denies taking any OTC medications for symptoms.

## 2023-11-22 NOTE — ED Provider Notes (Signed)
 [facility]    CSN: [id number] Arrival date & time: 11/22/23  1223      History   Chief Complaint Chief Complaint  Patient presents with   Abdominal Pain    HPI Brooke Baxter is a 23 y.o. female.    Abdominal Pain For 5 days has had abdominal discomfort, states after she eats her stomach becomes very randomly she has some lower abdominal discomfort and then will have diarrhea without blood, she is also had a lot of nausea which is interfered with her appetite.  Has had 1 or 2 episodes of vomiting. Denies fever, chills, frequency, urgency, dysuria, vaginal discharge, irregular vaginal bleeding, recent travel, bad food exposure, known contacts with similar symptoms.  Has had ovarian cysts in the past diagnosed by CT scan July 2024.  Admits history of cysts on her liver diagnosed by CT scan in 2020, has had elevated liver function tests. Her last menstrual period was over a year ago.   Past Medical History:  Diagnosis Date   Asthma    Constipation    UTI (urinary tract infection)     There are no active problems to display for this patient.   History reviewed. No pertinent surgical history.  OB History   No obstetric history on file.      Home Medications    Prior to Admission medications   Medication Sig Start Date End Date Taking? Authorizing Provider  dicyclomine (BENTYL) 20 MG tablet Take 1 tablet (20 mg total) by mouth 3 (three) times daily as needed for spasms. 03/20/23   Long, Arlyss Repress, MD  ibuprofen (ADVIL) 600 MG tablet Take 1 tablet (600 mg total) by mouth every 6 (six) hours as needed. 03/20/23   Long, Arlyss Repress, MD  norelgestromin-ethinyl estradiol [employer]) 150-35 MCG/24HR transdermal patch Place 1 patch onto the skin See admin instructions. Apply one patch topically every Sunday for 3 weeks, hold while on period for one week  then resume. 03/10/20   Anyanwu, Jethro Bastos, MD  ondansetron (ZOFRAN-ODT) 4 MG disintegrating tablet Take 1 tablet  (4 mg total) by mouth every 8 (eight) hours as needed. 03/20/23   Long, Arlyss Repress, MD    Family History History reviewed. No pertinent family history.  Social History Social History   Tobacco Use   Smoking status: Never   Smokeless tobacco: Never  Vaping Use   Vaping status: Never Used  Substance Use Topics   Alcohol use: Never   Drug use: Never     Allergies   Patient has no known allergies.   Review of Systems Review of Systems  Gastrointestinal:  Positive for abdominal pain.     Physical Exam Triage Vital Signs ED Triage Vitals  Encounter Vitals Group     BP 11/22/23 1235 126/65     Systolic BP Percentile --      Diastolic BP Percentile --      Pulse Rate 11/22/23 1235 83     Resp 11/22/23 1235 17     Temp 11/22/23 1235 98.5 F (36.9 C)     Temp Source 11/22/23 1235 Oral     SpO2 11/22/23 1235 97 %     Weight 11/22/23 1235 179 lb (81.2 kg)     Height 11/22/23 1235 5\' 2"  (1.575 m)     Head Circumference --      Peak Flow --      Pain Score 11/22/23 1246 0     Pain Loc --  Pain Education --      Exclude from Growth Chart --    No data found.  Updated Vital Signs BP 126/65 (BP Location: Right Arm)   Pulse 83   Temp 98.5 F (36.9 C) (Oral)   Resp 17   Ht 5\' 2"  (1.575 m)   Wt 179 lb (81.2 kg)   LMP  (Within Years)   SpO2 97%   BMI 32.74 kg/m   Visual Acuity Right Eye Distance:   Left Eye Distance:   Bilateral Distance:    Right Eye Near:   Left Eye Near:    Bilateral Near:     Physical Exam Constitutional:      General: She is not in acute distress.    Appearance: She is well-developed. She is not ill-appearing, toxic-appearing or diaphoretic.  HENT:     Head: Normocephalic.  Cardiovascular:     Rate and Rhythm: Normal rate and regular rhythm.  Pulmonary:     Effort: Pulmonary effort is normal.     Breath sounds: Normal breath sounds.  Abdominal:     General: Bowel sounds are normal.     Palpations: Abdomen is soft.      Tenderness: There is abdominal tenderness in the left lower quadrant. There is no right CVA tenderness, left CVA tenderness, guarding or rebound.  Skin:    General: Skin is warm and dry.  Neurological:     Mental Status: She is alert and oriented to person, place, and time.  Psychiatric:        Mood and Affect: Mood normal.      UC Treatments / Results  Labs (all labs ordered are listed, but only abnormal results are displayed) Labs Reviewed  POCT URINALYSIS DIP (MANUAL ENTRY)  POCT URINE PREGNANCY    EKG   Radiology No results found.  Procedures Procedures (including critical care time)  Medications Ordered in UC Medications - No data to display  Initial Impression / Assessment and Plan / UC Course  I have reviewed the triage vital signs and the nursing notes.  Pertinent labs & imaging results that were available during my care of the patient were reviewed by me and considered in my medical decision making (see chart for details).     23 year old female with nausea, vomiting diarrhea and abdominal pain for 5 days, she is nontoxic-appearing and is not reported fever.  She has some mild left lower quadrant tenderness which may be due to ovarian cysts.  Has not had a period in 1 year point-of-care pregnancy is negative.  Point-of-care urine dipstick is hazy with a high specific gravity greater than 1.030 trace protein otherwise normal, no leukocytes no ketones no bili no blood.  Do not suspect UTI, diverticulitis, renal colic.  Left lower quadrant pain likely due to ovarian cyst as she has had cysts in the past. Discussed with patient could be a viral infection will treat with Zofran and dietary changes, should go to ED for development of fever or severe pain otherwise should follow-up with her PCP on Monday Final Clinical Impressions(s) / UC Diagnoses   Final diagnoses:  Lower abdominal pain   Discharge Instructions   None    ED Prescriptions   None    PDMP not  reviewed this encounter.   Meliton Rattan, [place] 11/22/23 1313

## 2023-12-02 ENCOUNTER — Emergency Department (HOSPITAL_BASED_OUTPATIENT_CLINIC_OR_DEPARTMENT_OTHER)
Admission: EM | Admit: 2023-12-02 | Discharge: 2023-12-02 | Disposition: A | Discharge status: Home / Self Care | Payer: Self-pay | Attending: Emergency Medicine | Admitting: Emergency Medicine

## 2023-12-02 ENCOUNTER — Emergency Department (HOSPITAL_BASED_OUTPATIENT_CLINIC_OR_DEPARTMENT_OTHER): Payer: Self-pay

## 2023-12-02 ENCOUNTER — Encounter (HOSPITAL_BASED_OUTPATIENT_CLINIC_OR_DEPARTMENT_OTHER): Payer: Self-pay

## 2023-12-02 ENCOUNTER — Other Ambulatory Visit: Payer: Self-pay

## 2023-12-02 DIAGNOSIS — R109 Unspecified abdominal pain: Secondary | ICD-10-CM | POA: Insufficient documentation

## 2023-12-02 DIAGNOSIS — R112 Nausea with vomiting, unspecified: Secondary | ICD-10-CM | POA: Insufficient documentation

## 2023-12-02 DIAGNOSIS — R5383 Other fatigue: Secondary | ICD-10-CM | POA: Insufficient documentation

## 2023-12-02 DIAGNOSIS — R110 Nausea: Secondary | ICD-10-CM

## 2023-12-02 LAB — CBC
HCT: 42.1 % (ref 36.0–46.0)
Hemoglobin: 14.4 g/dL (ref 12.0–15.0)
MCH: 30.8 pg (ref 26.0–34.0)
MCHC: 34.2 g/dL (ref 30.0–36.0)
MCV: 90 fL (ref 80.0–100.0)
Platelets: 370 10*3/uL (ref 150–400)
RBC: 4.68 MIL/uL (ref 3.87–5.11)
RDW: 13.2 % (ref 11.5–15.5)
WBC: 9.5 10*3/uL (ref 4.0–10.5)
nRBC: 0 % (ref 0.0–0.2)

## 2023-12-02 LAB — COMPREHENSIVE METABOLIC PANEL
ALT: 200 U/L — ABNORMAL HIGH (ref 0–44)
AST: 97 U/L — ABNORMAL HIGH (ref 15–41)
Albumin: 4.5 g/dL (ref 3.5–5.0)
Alkaline Phosphatase: 70 U/L (ref 38–126)
Anion gap: 8 (ref 5–15)
BUN: 9 mg/dL (ref 6–20)
CO2: 25 mmol/L (ref 22–32)
Calcium: 9.6 mg/dL (ref 8.9–10.3)
Chloride: 105 mmol/L (ref 98–111)
Creatinine, Ser: 0.58 mg/dL (ref 0.44–1.00)
GFR, Estimated: >60 mL/min (ref >60)
Glucose, Bld: 82 mg/dL (ref 70–99)
Potassium: 3.7 mmol/L (ref 3.5–5.1)
Sodium: 138 mmol/L (ref 135–145)
Total Bilirubin: 0.9 mg/dL (ref 0.0–1.2)
Total Protein: 8.4 g/dL — ABNORMAL HIGH (ref 6.5–8.1)

## 2023-12-02 LAB — PREGNANCY, URINE: Preg Test, Ur: NEGATIVE

## 2023-12-02 LAB — URINALYSIS, ROUTINE W REFLEX MICROSCOPIC
Bilirubin Urine: NEGATIVE
Glucose, UA: NEGATIVE mg/dL
Hgb urine dipstick: NEGATIVE
Ketones, ur: NEGATIVE mg/dL
Leukocytes,Ua: NEGATIVE
Nitrite: NEGATIVE
Protein, ur: NEGATIVE mg/dL
Specific Gravity, Urine: >=1.03 (ref 1.005–1.030)
pH: 6 (ref 5.0–8.0)

## 2023-12-02 LAB — LIPASE, BLOOD: Lipase: 26 U/L (ref 11–51)

## 2023-12-02 MED ORDER — ONDANSETRON HCL 4 MG PO TABS: 4.0000 mg | ORAL_TABLET | Freq: Four times a day (QID) | ORAL | 0 refills | Status: AC

## 2023-12-02 MED ORDER — ONDANSETRON HCL 4 MG/2ML IJ SOLN
4.0000 mg | Freq: Once | INTRAMUSCULAR | Status: AC
  Administered 2023-12-02: 4 mg via INTRAVENOUS
  Filled 2023-12-02: qty 2

## 2023-12-02 MED ORDER — PANTOPRAZOLE SODIUM 20 MG PO TBEC: 20.0000 mg | DELAYED_RELEASE_TABLET | Freq: Every day | ORAL | 0 refills | Status: AC

## 2023-12-02 MED ORDER — LACTATED RINGERS IV BOLUS
1000.0000 mL | Freq: Once | INTRAVENOUS | Status: AC
  Administered 2023-12-02: 1000 mL via INTRAVENOUS

## 2023-12-02 NOTE — ED Provider Notes (Signed)
 [facility] Provider Note   CSN: [id number] Arrival date & time: 12/02/23  1436     History Chief Complaint  Patient presents with   Nausea   Fatigue    HPI Brooke [facility] is a 23 y.o. female presenting for nausea vomiting and abdominal pain. 23 year old female minimal medical history.  States that she has been getting preprandial nausea vomiting over the past 2 weeks.  Worse over the last 48 hours.  No known sick contacts otherwise.  Patient's recorded medical, surgical, social, medication list and allergies were reviewed in the Snapshot window as part of the initial history.   Review of Systems   Review of Systems  Constitutional:  Negative for chills and fever.  HENT:  Negative for ear pain and sore throat.   Eyes:  Negative for pain and visual disturbance.  Respiratory:  Negative for cough and shortness of breath.   Cardiovascular:  Negative for chest pain and palpitations.  Gastrointestinal:  Positive for abdominal pain and nausea. Negative for vomiting.  Genitourinary:  Negative for dysuria and hematuria.  Musculoskeletal:  Negative for arthralgias and back pain.  Skin:  Negative for color change and rash.  Neurological:  Negative for seizures and syncope.  All other systems reviewed and are negative.   Physical Exam Updated Vital Signs BP 105/88 (BP Location: Left Arm)   Pulse 75   Temp 98 F (36.7 C)   Resp 18   Ht 5\' 2"  (1.575 m)   Wt 81.2 kg   LMP  (Within Years)   SpO2 100%   BMI 32.74 kg/m  Physical Exam Vitals and nursing note reviewed.  Constitutional:      General: She is not in acute distress.    Appearance: She is well-developed.  HENT:     Head: Normocephalic and atraumatic.  Eyes:     Conjunctiva/sclera: Conjunctivae normal.  Cardiovascular:     Rate and Rhythm: Normal rate and regular rhythm.     Heart sounds: No murmur heard. Pulmonary:     Effort: Pulmonary effort is normal.  No respiratory distress.     Breath sounds: Normal breath sounds.  Abdominal:     General: There is no distension.     Palpations: Abdomen is soft.     Tenderness: There is no abdominal tenderness. There is no right CVA tenderness or left CVA tenderness.  Musculoskeletal:        General: No swelling or tenderness. Normal range of motion.     Cervical back: Neck supple.  Skin:    General: Skin is warm and dry.  Neurological:     General: No focal deficit present.     Mental Status: She is alert and oriented to person, place, and time. Mental status is at baseline.     Cranial Nerves: No cranial nerve deficit.      ED Course/ Medical Decision Making/ A&P    Procedures Procedures   Medications Ordered in ED Medications  lactated ringers bolus 1,000 mL (0 mLs Intravenous Stopped 12/02/23 1947)  ondansetron (ZOFRAN) injection 4 mg (4 mg Intravenous Given 12/02/23 1838)   Medical Decision Making:   [name] is a 23 y.o. female who presented to the ED today with abdominal pain, detailed above.    Patient placed on continuous vitals and telemetry monitoring while in ED which was reviewed periodically.  Complete initial physical exam performed, notably the patient  was HDS in NAD.     Reviewed and  confirmed nursing documentation for past medical history, family history, social history.    Initial Assessment:   With the patient's presentation of abdominal pain, most likely diagnosis is nonspecific etiology including gastritis/PU. Other diagnoses were considered including (but not limited to) gastroenteritis, colitis, small bowel obstruction, appendicitis, cholecystitis, pancreatitis, nephrolithiasis, UTI, pyleonephritis. These are considered less likely due to history of present illness and physical exam findings.   This is most consistent with an acute life/limb threatening illness complicated by underlying chronic conditions.   Initial Plan:  CBC/CMP to evaluate for  underlying infectious/metabolic etiology for patient's abdominal pain  Lipase to evaluate for pancreatitis  EKG to evaluate for cardiac source of pain  RUQ [place] to evaluate for structural/surgical etiology of patients' abdominal pain.  Urinalysis and repeat physical assessment to evaluate for UTI/Pyelonpehritis  Empiric management of symptoms with escalating pain control and antiemetics as needed.   Initial Study Results:   Laboratory  All laboratory results reviewed without evidence of clinically relevant pathology.    EKG EKG was reviewed independently. Rate, rhythm, axis, intervals all examined and without medically relevant abnormality. ST segments without concerns for elevations.    Radiology All images reviewed independently. Agree with radiology report at this time.   US Abdomen Limited RUQ (LIVER/GB) Result Date: 12/02/2023 CLINICAL DATA:  Abdominal pain and nausea for 2 weeks. EXAM: ULTRASOUND ABDOMEN LIMITED RIGHT UPPER QUADRANT COMPARISON:  CT 03/20/2023.  Ultrasound 11/20/2018 FINDINGS: Gallbladder: Physiologically distended. No gallstones or wall thickening visualized. No sonographic Murphy sign noted by sonographer. Common bile duct: Diameter: 4 mm, normal Liver: Mild increased parenchymal echogenicity. Area of focal fatty sparing adjacent to the gallbladder fossa. Hepatic cyst measuring 1.8 cm, also seen on priors. Portal vein is patent on color Doppler imaging with normal direction of blood flow towards the liver. Other: No right upper quadrant ascites. IMPRESSION: 1. Normal sonographic appearance of the gallbladder and biliary tree. 2. Mild increased hepatic parenchymal echogenicity typical of steatosis. Electronically Signed   By: Narda Rutherford M.D.   On: 12/02/2023 21:34      Final Reassessment and Plan:   No acute pathology.  Patient ambulatory tolerating p.o. intake on reassessment.  Will continue to treat for gastritis with pantoprazole and Zofran in the outpatient  setting with strict return precautions reinforced.  Disposition:  I have considered need for hospitalization, however, considering all of the above, I believe this patient is stable for discharge at this time.  Patient/family educated about specific return precautions for given chief complaint and symptoms.  Patient/family educated about follow-up with PCP.     Patient/family expressed understanding of return precautions and need for follow-up. Patient spoken to regarding all imaging and laboratory results and appropriate follow up for these results. All education provided in verbal form with additional information in written form. Time was allowed for answering of patient questions. Patient discharged.    Emergency Department Medication Summary:   Medications  lactated ringers bolus 1,000 mL (0 mLs Intravenous Stopped 12/02/23 1947)  ondansetron (ZOFRAN) injection 4 mg (4 mg Intravenous Given 12/02/23 1838)            Clinical Impression:  1. Nausea      Discharge   Final Clinical Impression(s) / ED Diagnoses Final diagnoses:  Nausea    Rx / DC Orders ED Discharge Orders          Ordered    ondansetron (ZOFRAN) 4 MG tablet  Every 6 hours        12/02/23 2139  pantoprazole (PROTONIX) 20 MG tablet  Daily        12/02/23 2139              Glyn Ade, MD 12/02/23 2140

## 2023-12-02 NOTE — ED Triage Notes (Signed)
 Patient was arrives with complaints of nausea, dizziness, fatigue x1 week. Patient was seen at Urgent Care for the same, with no diagnosis. Patient also had blood pregnancy test that was negative as well.

## 2023-12-02 NOTE — Discharge Instructions (Addendum)
 You have mild abnormalities in your liver function test. You need to follow-up with a primary care doctor in the outpatient setting.  Please call the number as provided to get a new PCP.

## 2024-09-26 ENCOUNTER — Encounter (HOSPITAL_BASED_OUTPATIENT_CLINIC_OR_DEPARTMENT_OTHER): Payer: Self-pay

## 2024-09-26 ENCOUNTER — Emergency Department (HOSPITAL_BASED_OUTPATIENT_CLINIC_OR_DEPARTMENT_OTHER)
Admission: EM | Admit: 2024-09-26 | Discharge: 2024-09-26 | Disposition: A | Discharge status: Home / Self Care | Payer: Self-pay | Attending: Emergency Medicine | Admitting: Emergency Medicine

## 2024-09-26 ENCOUNTER — Other Ambulatory Visit: Payer: Self-pay

## 2024-09-26 DIAGNOSIS — M5441 Lumbago with sciatica, right side: Secondary | ICD-10-CM | POA: Insufficient documentation

## 2024-09-26 LAB — URINALYSIS, ROUTINE W REFLEX MICROSCOPIC
Bilirubin Urine: NEGATIVE
Glucose, UA: NEGATIVE mg/dL
Hgb urine dipstick: NEGATIVE
Ketones, ur: NEGATIVE mg/dL
Leukocytes,Ua: NEGATIVE
Nitrite: NEGATIVE
Protein, ur: NEGATIVE mg/dL
Specific Gravity, Urine: 1.015 (ref 1.005–1.030)
pH: 7 (ref 5.0–8.0)

## 2024-09-26 LAB — BASIC METABOLIC PANEL WITH GFR
Anion gap: 12 (ref 5–15)
BUN: 7 mg/dL (ref 6–20)
CO2: 25 mmol/L (ref 22–32)
Calcium: 9.8 mg/dL (ref 8.9–10.3)
Chloride: 104 mmol/L (ref 98–111)
Creatinine, Ser: 0.63 mg/dL (ref 0.44–1.00)
GFR, Estimated: >60 mL/min
Glucose, Bld: 110 mg/dL — ABNORMAL HIGH (ref 70–99)
Potassium: 3.8 mmol/L (ref 3.5–5.1)
Sodium: 140 mmol/L (ref 135–145)

## 2024-09-26 LAB — CBC
HCT: 40.6 % (ref 36.0–46.0)
Hemoglobin: 13.9 g/dL (ref 12.0–15.0)
MCH: 30.6 pg (ref 26.0–34.0)
MCHC: 34.2 g/dL (ref 30.0–36.0)
MCV: 89.4 fL (ref 80.0–100.0)
Platelets: 399 K/uL (ref 150–400)
RBC: 4.54 MIL/uL (ref 3.87–5.11)
RDW: 12.7 % (ref 11.5–15.5)
WBC: 9.3 K/uL (ref 4.0–10.5)
nRBC: 0 % (ref 0.0–0.2)

## 2024-09-26 LAB — PREGNANCY, URINE: Preg Test, Ur: NEGATIVE

## 2024-09-26 MED ORDER — PREDNISONE 20 MG PO TABS: 40.0000 mg | ORAL_TABLET | Freq: Every day | ORAL | 0 refills | Status: AC

## 2024-09-26 MED ORDER — KETOROLAC TROMETHAMINE 15 MG/ML IJ SOLN
15.0000 mg | Freq: Once | INTRAMUSCULAR | Status: AC
  Administered 2024-09-26: 15 mg via INTRAMUSCULAR
  Filled 2024-09-26: qty 1

## 2024-09-26 MED ORDER — CYCLOBENZAPRINE HCL 10 MG PO TABS
10.0000 mg | ORAL_TABLET | Freq: Once | ORAL | Status: AC
  Administered 2024-09-26: 10 mg via ORAL
  Filled 2024-09-26: qty 1

## 2024-09-26 MED ORDER — NAPROXEN 500 MG PO TABS: 500.0000 mg | ORAL_TABLET | Freq: Two times a day (BID) | ORAL | 0 refills | Status: AC

## 2024-09-26 NOTE — ED Notes (Signed)
 Patient transferred from waiting room to ED treatment room. Assuming pt care at this time.

## 2024-09-26 NOTE — Discharge Instructions (Addendum)
 You were given a short course of steroids to help with your symptoms, please take 2 tablets daily for the next 5 days.  Please be aware this medication can cause flushness along with appetite changes or insomnia.  You were also given naproxen  this is an anti-inflammatory, please take 1 tablet twice a day with food for the next 7 days.

## 2024-09-26 NOTE — ED Triage Notes (Signed)
 Pt reports lower back pain that started a few days ago but the pain worsened today. She reports it is hard for her to sit down. Denies known injury. No bending or heavy lifting. Recent UTI 15 days ago and she completed her course of abx.

## 2024-09-26 NOTE — ED Provider Notes (Signed)
 " [place] [facility] Provider Note   CSN: [id number] Arrival date & time: 09/26/24  1539     Patient presents with: Back Pain   Brooke Baxter is a 24 y.o. female.   24 y.o female with no PMH presents to the ED with a chief complaint of back pain x 3 days ago. Patient describes a sharp, stabbing on the lumbar spine with radiation down the right leg. Pain is exacerbated with ambulation along with sitting down.  She has not taken any medication for improvement in her symptoms as her pain worsened today.  She does report being treated for urinary tract infection several weeks ago, followed up with her primary care doctor who recommended she be evaluated tomorrow however she reports the pain got so severe she could not wait.  No fever, no prior history of IV drug use, no numbness, no weakness.   The history is provided by the patient.  Back Pain Associated symptoms: no chest pain and no fever        Prior to Admission medications  Medication Sig Start Date End Date Taking? Authorizing Provider  naproxen  (NAPROSYN ) 500 MG tablet Take 1 tablet (500 mg total) by mouth 2 (two) times daily for 7 days. 09/26/24 10/03/24 Yes Shevelle Smither, PA-C  predniSONE  (DELTASONE ) 20 MG tablet Take 2 tablets (40 mg total) by mouth daily for 5 days. 09/26/24 10/01/24 Yes Lizzy Hamre, PA-C  ondansetron  (ZOFRAN ) 4 MG tablet Take 1 tablet (4 mg total) by mouth every 6 (six) hours. 12/02/23   Jerral Meth, MD  pantoprazole  (PROTONIX ) 20 MG tablet Take 1 tablet (20 mg total) by mouth daily. 12/02/23   Jerral Meth, MD    Allergies: Patient has no known allergies.    Review of Systems  Constitutional:  Negative for fever.  Respiratory:  Negative for shortness of breath.   Cardiovascular:  Negative for chest pain.  Gastrointestinal:  Negative for nausea and vomiting.  Genitourinary:  Negative for difficulty urinating.  Musculoskeletal:  Positive for back  pain.  All other systems reviewed and are negative.   Updated Vital Signs BP 122/84 (BP Location: Left Arm)   Pulse 69   Temp 97.8 F (36.6 C) (Oral)   Resp 16   Ht 5' 2 (1.575 m)   Wt 81.6 kg   LMP  (LMP Unknown)   SpO2 100%   BMI 32.92 kg/m   Physical Exam Vitals and nursing note reviewed.  Constitutional:      Appearance: Normal appearance.  HENT:     Head: Normocephalic and atraumatic.     Mouth/Throat:     Mouth: Mucous membranes are moist.  Cardiovascular:     Rate and Rhythm: Normal rate.  Pulmonary:     Effort: Pulmonary effort is normal.  Abdominal:     General: Abdomen is flat.  Musculoskeletal:     Cervical back: Normal range of motion and neck supple.     Comments: RLE- KF,KE 5/5 strength LLE- HF, HE 5/5 strength Normal gait. No pronator drift. No leg drop.  CN I, II and VIII not tested. CN II-XII grossly intact bilaterally.      Skin:    General: Skin is warm and dry.  Neurological:     Mental Status: She is alert and oriented to person, place, and time.     (all labs ordered are listed, but only abnormal results are displayed) Labs Reviewed  BASIC METABOLIC PANEL WITH GFR - Abnormal; Notable for  the following components:      Result Value   Glucose, Bld 110 (*)    All other components within normal limits  URINALYSIS, ROUTINE W REFLEX MICROSCOPIC  PREGNANCY, URINE  CBC    EKG: None  Radiology: No results found.   Procedures   Medications Ordered in the ED  ketorolac  (TORADOL ) 15 MG/ML injection 15 mg (15 mg Intramuscular Given 09/26/24 1819)  cyclobenzaprine  (FLEXERIL ) tablet 10 mg (10 mg Oral Given 09/26/24 1818)                                    Medical Decision Making Amount and/or Complexity of Data Reviewed Labs: ordered.  Risk Prescription drug management.   Present to the ED with a chief complaint of back pain that is been ongoing for the past 3 days exacerbated today with worsening pain.  Prior history of a  urinary tract infection that was treated with completion of antibiotics.  She is not having any nausea, vomiting, fever.  No urinary symptoms on today's visit however she did have blood work while in the waiting room.  Blood work is benign BMP without any electrolyte derangement, current is within normal limits.  CBC with no leukocytosis.  Urinalysis without any nitrites, leukocytes.  Urine pregnancy is negative.  On evaluation she is nontoxic-appearing, no red flags no prior history of cancer, no fever.  She does have pain with palpation along the right lower lumbar spine radiating down her right leg consistent with sciatica.  Given medication to help with pain such as Flexeril , Toradol .  I do not suspect urinary tract infection.  No other concerning exam findings.  7:11 PM patient reassessed after medication, she does feel some mild improvement in her symptoms, we discussed short course of steroids along with anti-inflammatories to help with pain control.  She denies any underlying history of diabetes.  She is ambulatory in the ED with a steady gait.  Patient is stable for discharge.   Portions of this note were generated with Scientist, clinical (histocompatibility and immunogenetics). Dictation errors may occur despite best attempts at proofreading.   Final diagnoses:  Acute right-sided low back pain with right-sided sciatica    ED Discharge Orders          Ordered    predniSONE  (DELTASONE ) 20 MG tablet  Daily        09/26/24 1835    naproxen  (NAPROSYN ) 500 MG tablet  2 times daily        09/26/24 1835               Aanvi Voyles, PA-C 09/26/24 1911    Patt Alm Macho, MD 09/26/24 2048  "
